# Patient Record
Sex: Female | Born: 1995 | Race: White | Hispanic: No | Marital: Single | Smoking: Former smoker
Health system: Southern US, Community
[De-identification: ages and names within clinical notes are randomized; demographics above are authoritative.]

## PROBLEM LIST (undated history)

## (undated) DIAGNOSIS — L309 Dermatitis, unspecified: Secondary | ICD-10-CM

## (undated) HISTORY — PX: CATARACT EXTRACTION: SUR2

---
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
## 2018-04-01 ENCOUNTER — Emergency Department (HOSPITAL_COMMUNITY): Payer: Medicaid Other

## 2018-04-01 ENCOUNTER — Emergency Department (HOSPITAL_COMMUNITY)
Admission: EM | Admit: 2018-04-01 | Discharge: 2018-04-01 | Disposition: A | Discharge status: Home / Self Care | Payer: Medicaid Other | Attending: Emergency Medicine | Admitting: Emergency Medicine

## 2018-04-01 ENCOUNTER — Encounter (HOSPITAL_COMMUNITY): Payer: Self-pay

## 2018-04-01 ENCOUNTER — Other Ambulatory Visit: Payer: Self-pay

## 2018-04-01 DIAGNOSIS — Y929 Unspecified place or not applicable: Secondary | ICD-10-CM | POA: Diagnosis not present

## 2018-04-01 DIAGNOSIS — Y999 Unspecified external cause status: Secondary | ICD-10-CM | POA: Insufficient documentation

## 2018-04-01 DIAGNOSIS — Z87891 Personal history of nicotine dependence: Secondary | ICD-10-CM | POA: Diagnosis not present

## 2018-04-01 DIAGNOSIS — T148XXA Other injury of unspecified body region, initial encounter: Secondary | ICD-10-CM

## 2018-04-01 DIAGNOSIS — Z79899 Other long term (current) drug therapy: Secondary | ICD-10-CM | POA: Diagnosis not present

## 2018-04-01 DIAGNOSIS — S0012XA Contusion of left eyelid and periocular area, initial encounter: Secondary | ICD-10-CM | POA: Insufficient documentation

## 2018-04-01 DIAGNOSIS — Y939 Activity, unspecified: Secondary | ICD-10-CM | POA: Diagnosis not present

## 2018-04-01 DIAGNOSIS — S42402A Unspecified fracture of lower end of left humerus, initial encounter for closed fracture: Secondary | ICD-10-CM | POA: Diagnosis not present

## 2018-04-01 DIAGNOSIS — S0990XA Unspecified injury of head, initial encounter: Secondary | ICD-10-CM

## 2018-04-01 DIAGNOSIS — S59912A Unspecified injury of left forearm, initial encounter: Secondary | ICD-10-CM | POA: Diagnosis present

## 2018-04-01 LAB — POCT PREGNANCY, URINE: Preg Test, Ur: NEGATIVE

## 2018-04-01 MED ORDER — ACETAMINOPHEN 500 MG PO TABS
1000.0000 mg | ORAL_TABLET | Freq: Once | ORAL | Status: AC
  Administered 2018-04-01: 1000 mg via ORAL
  Filled 2018-04-01: qty 2

## 2018-04-01 MED ORDER — HYDROCODONE-ACETAMINOPHEN 5-325 MG PO TABS: 1.0000 | ORAL_TABLET | Freq: Four times a day (QID) | ORAL | 0 refills | Status: DC | PRN

## 2018-04-01 NOTE — ED Notes (Signed)
Patient educated about when to see a doctor if any numbness or tingling, or discoloration of fingers. Patient has no complaints at this time.

## 2018-04-01 NOTE — Discharge Instructions (Signed)
As we discussed, your x-ray today looked concerning for a fracture.  This is why we have placed a splint on it.  Please do not get the splint wet.  You will need to follow-up with referred orthopedic doctor.  Call their office and arrange for an appointment.  Keep the arm elevated and in the sling to help with support and stabilization.  You can take Tylenol or Ibuprofen as directed for pain. You can alternate Tylenol and Ibuprofen every 4 hours. If you take Tylenol at 1pm, then you can take Ibuprofen at 5pm. Then you can take Tylenol again at 9pm.  You can take pain medication for severe or breakthrough pain.   As we discussed, you may have some postconcussive symptoms.  Please engage in brain rest.  This means limiting the amount of computer, phone, TV use.  Return to the emergency department for any vision changes, vomiting, difficulty walking, difficulty moving her arms or legs or any other worsening or concerning symptoms.
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
## 2018-04-01 NOTE — ED Notes (Signed)
Pt is aware a urine sample is needed, but is unable to provide one at this time. Specimen cup at bedside. 
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
## 2018-04-03 ENCOUNTER — Telehealth (INDEPENDENT_AMBULATORY_CARE_PROVIDER_SITE_OTHER): Payer: Self-pay | Admitting: Orthopaedic Surgery

## 2018-04-03 NOTE — Telephone Encounter (Signed)
Please advise on whether you need to see patient sooner or keep follow up appointment on 04/15/18.  I will advise to stay in the splint until follow up in office.

## 2018-04-03 NOTE — Telephone Encounter (Signed)
Leave appt as scheduled, needs repeat elbow xrays out of splint when she comes in on the 3rd. I called FYI thanks

## 2018-04-03 NOTE — Telephone Encounter (Signed)
noted 

## 2018-04-03 NOTE — Telephone Encounter (Signed)
[facility] and referred to Dr. Ophelia CharterYates for fx of left elbow. She said she is currently in a splint and doesn't know how long she can keep that on. She is currently scheduled 9/3. Should she be worked in sooner? Will have to work around patients class schedules Patients # [id number]

## 2018-04-15 ENCOUNTER — Ambulatory Visit (INDEPENDENT_AMBULATORY_CARE_PROVIDER_SITE_OTHER): Payer: Self-pay | Admitting: Orthopaedic Surgery

## 2018-06-17 ENCOUNTER — Emergency Department (HOSPITAL_COMMUNITY): Payer: Medicaid Other

## 2018-06-17 ENCOUNTER — Emergency Department (HOSPITAL_COMMUNITY)
Admission: EM | Admit: 2018-06-17 | Discharge: 2018-06-17 | Disposition: A | Discharge status: Home / Self Care | Payer: Medicaid Other | Attending: Emergency Medicine | Admitting: Emergency Medicine

## 2018-06-17 DIAGNOSIS — M545 Low back pain, unspecified: Secondary | ICD-10-CM

## 2018-06-17 DIAGNOSIS — Z79899 Other long term (current) drug therapy: Secondary | ICD-10-CM | POA: Diagnosis not present

## 2018-06-17 DIAGNOSIS — Z87891 Personal history of nicotine dependence: Secondary | ICD-10-CM | POA: Insufficient documentation

## 2018-06-17 LAB — URINALYSIS, ROUTINE W REFLEX MICROSCOPIC
BILIRUBIN URINE: NEGATIVE
Glucose, UA: NEGATIVE mg/dL
Hgb urine dipstick: NEGATIVE
KETONES UR: 5 mg/dL — AB
LEUKOCYTES UA: NEGATIVE
Nitrite: NEGATIVE
PH: 5 (ref 5.0–8.0)
Protein, ur: 30 mg/dL — AB
SPECIFIC GRAVITY, URINE: 1.033 — AB (ref 1.005–1.030)

## 2018-06-17 LAB — PREGNANCY, URINE: PREG TEST UR: NEGATIVE

## 2018-06-17 MED ORDER — CYCLOBENZAPRINE HCL 10 MG PO TABS
10.0000 mg | ORAL_TABLET | Freq: Once | ORAL | Status: AC
Start: 2018-06-17 — End: 2018-06-17
  Administered 2018-06-17: 10 mg via ORAL
  Filled 2018-06-17: qty 1

## 2018-06-17 MED ORDER — CYCLOBENZAPRINE HCL 10 MG PO TABS: 10.0000 mg | ORAL_TABLET | Freq: Two times a day (BID) | ORAL | 0 refills | Status: AC | PRN

## 2018-06-17 NOTE — ED Notes (Signed)
Patient transported to X-ray 

## 2018-06-17 NOTE — ED Triage Notes (Signed)
Pt states since Saturday she has had lower back pain, hx of UTIs. No abd pain, nausea but no vomiting.

## 2018-06-17 NOTE — Discharge Instructions (Addendum)
Take Motrin and Tylenol. Take Flexeril as needed as prescribed.  Do not drive or operate machinery while taking Flexeril. Follow-up with primary care provider, referral was given today. Return to the ER for severe or concerning symptoms.

## 2018-06-17 NOTE — ED Provider Notes (Signed)
[facility] EMERGENCY DEPARTMENT Provider Note   CSN: [id number] Arrival date & time: 06/17/18  1310     History   Chief Complaint Chief Complaint  Patient presents with  . Back Pain    HPI Kiara Obrien is a 22 y.o. female.  22 year old female presents with complaint of low back pain x4 days.  Pain is constant, aching in nature, worse at night as well as worse with movement.  He denies any falls, injuries, fevers, chills, abdominal pain, changes in bowel or bladder habits, vaginal discharge, IV drug use.  Patient states the pain feels similar to when she had a kidney infection.  Reports one episode of vomiting last night.  Patient took Tylenol without relief of her pain.  No other complaints or concerns.     Past Medical History:  Diagnosis Date  . Eczema     Patient Active Problem List   Diagnosis Date Noted  . Pyelonephritis 10/12/2016    Past Surgical History:  Procedure Laterality Date  . CATARACT EXTRACTION       OB History   None      Home Medications    Prior to Admission medications   Medication Sig Start Date End Date Taking? Authorizing Provider  acetaminophen (TYLENOL) 500 MG tablet Take 500 mg by mouth every 6 (six) hours as needed (for pain).   Yes [provider]  ibuprofen (ADVIL,MOTRIN) 200 MG tablet Take 200 mg by mouth every 6 (six) hours as needed (for pain).    Yes [provider]  norgestimate-ethinyl estradiol (SPRINTEC 28) 0.25-35 MG-MCG tablet Take 1 tablet by mouth daily.   Yes [provider]  cyclobenzaprine (FLEXERIL) 10 MG tablet Take 1 tablet (10 mg total) by mouth 2 (two) times daily as needed for muscle spasms. 06/17/18   Jeannie Fend, PA-C    Family History Family History  Problem Relation Age of Onset  . Diabetes Mellitus II Neg Hx     Social History Social History   Tobacco Use  . Smoking status: Former Smoker    Packs/day: 0.50    Types: Cigarettes  . Smokeless tobacco:  Never Used  Substance Use Topics  . Alcohol use: No  . Drug use: No     Allergies   Patient has no known allergies.   Review of Systems Review of Systems  Constitutional: Negative for chills and fever.  Gastrointestinal: Positive for nausea and vomiting. Negative for abdominal distention, abdominal pain, constipation and diarrhea.  Genitourinary: Negative for difficulty urinating, dysuria, frequency, urgency and vaginal discharge.  Musculoskeletal: Positive for back pain. Negative for arthralgias, gait problem, myalgias, neck pain and neck stiffness.  Skin: Negative for rash and wound.  Allergic/Immunologic: Negative for immunocompromised state.  Neurological: Negative for weakness and numbness.  Hematological: Negative for adenopathy.  Psychiatric/Behavioral: Negative for confusion.  All other systems reviewed and are negative.    Physical Exam Updated Vital Signs BP 105/62 (BP Location: Right Arm)   Pulse 78   Temp 98.1 F (36.7 C) (Oral)   Resp 18   Ht 5\' 1"  (1.549 m)   Wt 48.1 kg   LMP 06/03/2018 (Within Days) Comment: Negative preg test(06/17/18)  SpO2 100%   BMI 20.03 kg/m   Physical Exam  Constitutional: She is oriented to person, place, and time. She appears well-developed and well-nourished. No distress.  HENT:  Head: Normocephalic and atraumatic.  Cardiovascular: Normal rate, regular rhythm, normal heart sounds and intact distal pulses.  No murmur heard.  Pulmonary/Chest: Effort normal and breath sounds normal. No respiratory distress.  Abdominal: Soft. Bowel sounds are normal. She exhibits no distension. There is no tenderness. There is no CVA tenderness.  Musculoskeletal: Normal range of motion. She exhibits no tenderness or deformity.       Thoracic back: Normal. She exhibits normal range of motion, no tenderness and no bony tenderness.       Lumbar back: Normal. She exhibits normal range of motion, no tenderness and no bony tenderness.  Neurological: She  is alert and oriented to person, place, and time. She has normal strength. She displays normal reflexes. No sensory deficit. She exhibits normal muscle tone. Gait normal.  Reflex Scores:      Patellar reflexes are 1+ on the right side and 1+ on the left side.      Achilles reflexes are 1+ on the right side and 1+ on the left side. Skin: Skin is warm and dry. No rash noted. She is not diaphoretic. No erythema.  Psychiatric: She has a normal mood and affect. Her behavior is normal.  Nursing note and vitals reviewed.    ED Treatments / Results  Labs (all labs ordered are listed, but only abnormal results are displayed) Labs Reviewed  URINALYSIS, ROUTINE W REFLEX MICROSCOPIC - Abnormal; Notable for the following components:      Result Value   APPearance HAZY (*)    Specific Gravity, Urine 1.033 (*)    Ketones, ur 5 (*)    Protein, ur 30 (*)    Bacteria, UA RARE (*)    All other components within normal limits  PREGNANCY, URINE  POC URINE PREG, ED    EKG None  Radiology Dg Lumbar Spine Complete  Result Date: 06/17/2018 CLINICAL DATA:  Acute low back pain without known injury. EXAM: LUMBAR SPINE - COMPLETE 4+ VIEW COMPARISON:  CT scan of October 12, 2016. FINDINGS: There is no evidence of lumbar spine fracture. Alignment is normal. Intervertebral disc spaces are maintained. IMPRESSION: Negative. Electronically Signed   By: Lupita Raider, M.D.   On: 06/17/2018 15:55    Procedures Procedures (including critical care time)  Medications Ordered in ED Medications  cyclobenzaprine (FLEXERIL) tablet 10 mg (10 mg Oral Given 06/17/18 1620)     Initial Impression / Assessment and Plan / ED Course  I have reviewed the triage vital signs and the nursing notes.  Pertinent labs & imaging results that were available during my care of the patient were reviewed by me and considered in my medical decision making (see chart for details).  Clinical Course as of Jun 17 1630  Tue Jun 17, 2018    2439 22 year old female presents with complaint of low back pain without injury.  Exam is unremarkable including full range of motion of the back without pain, abdomen soft nontender, pain not reproduced with palpation.  X-ray is unremarkable, urinalysis with ketones and protein with rare bacteria, nitrate leukocyte negative.  Patient referred to PCP for follow-up, return to ER for severe concerning symptoms.  Prescription for Flexeril.   [LM]    Clinical Course User Index [LM] Jeannie Fend, PA-C   Final Clinical Impressions(s) / ED Diagnoses   Final diagnoses:  Acute midline low back pain without sciatica    ED Discharge Orders         Ordered    cyclobenzaprine (FLEXERIL) 10 MG tablet  2 times daily PRN     06/17/18 1629  Jeannie Fend, PA-C 06/17/18 1631    Derwood Kaplan, MD 06/18/18 1902

## 2018-06-23 ENCOUNTER — Ambulatory Visit: Payer: Medicaid Other | Admitting: Family Medicine
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
## 2020-12-14 ENCOUNTER — Ambulatory Visit
Admission: EM | Admit: 2020-12-14 | Discharge: 2020-12-14 | Disposition: A | Discharge status: Home / Self Care | Payer: Managed Care, Other (non HMO) | Attending: Family Medicine | Admitting: Family Medicine

## 2020-12-14 ENCOUNTER — Other Ambulatory Visit: Payer: Self-pay

## 2020-12-14 DIAGNOSIS — W57XXXA Bitten or stung by nonvenomous insect and other nonvenomous arthropods, initial encounter: Secondary | ICD-10-CM

## 2020-12-14 DIAGNOSIS — S50861A Insect bite (nonvenomous) of right forearm, initial encounter: Secondary | ICD-10-CM | POA: Diagnosis not present

## 2020-12-14 DIAGNOSIS — R21 Rash and other nonspecific skin eruption: Secondary | ICD-10-CM

## 2020-12-14 DIAGNOSIS — N63 Unspecified lump in unspecified breast: Secondary | ICD-10-CM

## 2020-12-14 MED ORDER — DOXYCYCLINE HYCLATE 100 MG PO TABS: 100.0000 mg | ORAL_TABLET | Freq: Two times a day (BID) | ORAL | 0 refills | Status: AC

## 2020-12-14 NOTE — ED Provider Notes (Signed)
[facility]    CSN: [id number] Arrival date & time: 12/14/20  0817      History   Chief Complaint Chief Complaint  Patient presents with  . Wound Infection    HPI Rayvn Rickerson is a 25 y.o. female.   Patient presenting today concerned about a tick bite to right forearm that has a bull's-eye rash that she first noticed 3 days ago.  Area is a bit itchy but otherwise she is overall asymptomatic.  She denies any fever, chills, body aches, headaches, nausea vomiting.  Has a family history of Lyme disease with similar rashes so became concerned.  She has also been dealing with a right-sided breast mass that she noticed 2 weeks ago.  Minimally tender, no skin changes, no growth since noticed.  Denies any family history of breast disease, unintentional weight changes, nipple discharge.  Does not have an OB/GYN in the area.     Past Medical History:  Diagnosis Date  . Eczema     Patient Active Problem List   Diagnosis Date Noted  . Pyelonephritis 10/12/2016    Past Surgical History:  Procedure Laterality Date  . CATARACT EXTRACTION      OB History   No obstetric history on file.      Home Medications    Prior to Admission medications   Medication Sig Start Date End Date Taking? Authorizing Provider  doxycycline (VIBRA-TABS) 100 MG tablet Take 1 tablet (100 mg total) by mouth 2 (two) times daily. 12/14/20  Yes Particia Nearing, PA-C  acetaminophen (TYLENOL) 500 MG tablet Take 500 mg by mouth every 6 (six) hours as needed (for pain).    [provider]  cyclobenzaprine (FLEXERIL) 10 MG tablet Take 1 tablet (10 mg total) by mouth 2 (two) times daily as needed for muscle spasms. 06/17/18   Jeannie Fend, PA-C  ibuprofen (ADVIL,MOTRIN) 200 MG tablet Take 200 mg by mouth every 6 (six) hours as needed (for pain).     [provider]  norgestimate-ethinyl estradiol (SPRINTEC 28) 0.25-35 MG-MCG tablet Take 1 tablet by mouth daily.    [provider]    Family History Family History  Problem Relation Age of Onset  . Diabetes Mellitus II Neg Hx     Social History Social History   Tobacco Use  . Smoking status: Former Smoker    Packs/day: 0.50    Types: Cigarettes  . Smokeless tobacco: Never Used  Vaping Use  . Vaping Use: Never used  Substance Use Topics  . Alcohol use: No  . Drug use: No     Allergies   Patient has no known allergies.   Review of Systems Review of Systems Per HPI  Physical Exam Triage Vital Signs ED Triage Vitals  Enc Vitals Group     BP 12/14/20 0846 111/74     Pulse Rate 12/14/20 0846 78     Resp 12/14/20 0846 16     Temp 12/14/20 0847 97.8 F (36.6 C)     Temp Source 12/14/20 0846 Oral     SpO2 12/14/20 0846 98 %     Weight --      Height --      Head Circumference --      Peak Flow --      Pain Score 12/14/20 0845 0     Pain Loc --      Pain Edu? --      Excl. in GC? --    No  data found.  Updated Vital Signs BP 111/74 (BP Location: Right Arm)   Pulse 78   Temp 97.8 F (36.6 C) (Oral)   Resp 16   LMP 12/09/2020   SpO2 98%   Visual Acuity Right Eye Distance:   Left Eye Distance:   Bilateral Distance:    Right Eye Near:   Left Eye Near:    Bilateral Near:     Physical Exam Vitals and nursing note reviewed.  Constitutional:      Appearance: Normal appearance. She is not ill-appearing.  HENT:     Head: Atraumatic.     Mouth/Throat:     Mouth: Mucous membranes are moist.     Pharynx: Oropharynx is clear.  Eyes:     Extraocular Movements: Extraocular movements intact.     Conjunctiva/sclera: Conjunctivae normal.  Cardiovascular:     Rate and Rhythm: Normal rate and regular rhythm.     Heart sounds: Normal heart sounds.  Pulmonary:     Effort: Pulmonary effort is normal.     Breath sounds: Normal breath sounds.  Chest:  Breasts:     Right: No axillary adenopathy.     Left: No axillary adenopathy.     Abdominal:     General: Bowel sounds  are normal. There is no distension.     Palpations: Abdomen is soft.     Tenderness: There is no abdominal tenderness. There is no right CVA tenderness, left CVA tenderness or guarding.  Musculoskeletal:        General: Normal range of motion.     Cervical back: Normal range of motion and neck supple.  Lymphadenopathy:     Upper Body:     Right upper body: No axillary adenopathy.     Left upper body: No axillary adenopathy.  Skin:    General: Skin is warm and dry.     Findings: Rash present.     Comments: Dark erythematous center with lighter ring around central erythema at site of tick bite right forearm  Neurological:     Mental Status: She is alert and oriented to person, place, and time.  Psychiatric:        Mood and Affect: Mood normal.        Thought Content: Thought content normal.        Judgment: Judgment normal.      UC Treatments / Results  Labs (all labs ordered are listed, but only abnormal results are displayed) Labs Reviewed - No data to display  EKG   Radiology No results found.  Procedures Procedures (including critical care time)  Medications Ordered in UC Medications - No data to display  Initial Impression / Assessment and Plan / UC Course  I have reviewed the triage vital signs and the nursing notes.  Pertinent labs & imaging results that were available during my care of the patient were reviewed by me and considered in my medical decision making (see chart for details).     Questionable bull's-eye rash right forearm at site of recent tick bite.  Will send full course of doxycycline in case becoming symptomatic in the next few weeks but discussed that too early to show symptoms test at this point.  Also discussed GYN follow-up in the next week to recheck the breast mass and order ultrasound imaging if still present.  Suspect hormonal breast tissue changes and she is low risk overall but should be imaged if persisting or growing.  Final Clinical  Impressions(s) / UC Diagnoses  Final diagnoses:  Tick bite of right forearm, initial encounter  Breast mass  Rash   Discharge Instructions   None    ED Prescriptions    Medication Sig Dispense Auth. Provider   doxycycline (VIBRA-TABS) 100 MG tablet Take 1 tablet (100 mg total) by mouth 2 (two) times daily. 42 tablet Particia Nearing, [place]     PDMP not reviewed this encounter.   Roosvelt Maser [place], [place] 12/14/20 [phone]

## 2020-12-14 NOTE — ED Triage Notes (Signed)
Patient presents to Urgent Care with complaints of a possible tick bite 3 days ago located on her right forearm. She is concerned of possible lyme diease. Treating rash with neosporin.   Denies fever.
